# Patient Record
Sex: Female | Born: 1945 | Race: White | Hispanic: No | Marital: Married | Smoking: Former smoker
Health system: Southern US, Community
[De-identification: ages and names within clinical notes are randomized; demographics above are authoritative.]

## PROBLEM LIST (undated history)

## (undated) DIAGNOSIS — E119 Type 2 diabetes mellitus without complications: Secondary | ICD-10-CM

## (undated) DIAGNOSIS — I10 Essential (primary) hypertension: Secondary | ICD-10-CM

## (undated) DIAGNOSIS — E785 Hyperlipidemia, unspecified: Secondary | ICD-10-CM

## (undated) DIAGNOSIS — F419 Anxiety disorder, unspecified: Secondary | ICD-10-CM

## (undated) HISTORY — PX: BACK SURGERY: SHX140

---

## 2008-06-22 ENCOUNTER — Ambulatory Visit: Payer: Self-pay | Admitting: Family Medicine

## 2008-06-22 DIAGNOSIS — E785 Hyperlipidemia, unspecified: Secondary | ICD-10-CM | POA: Insufficient documentation

## 2008-06-22 DIAGNOSIS — I10 Essential (primary) hypertension: Secondary | ICD-10-CM | POA: Insufficient documentation

## 2008-06-22 DIAGNOSIS — E109 Type 1 diabetes mellitus without complications: Secondary | ICD-10-CM | POA: Insufficient documentation

## 2008-06-22 DIAGNOSIS — J209 Acute bronchitis, unspecified: Secondary | ICD-10-CM | POA: Insufficient documentation

## 2008-06-23 ENCOUNTER — Telehealth: Payer: Self-pay | Admitting: Family Medicine

## 2010-05-05 IMAGING — CR DG CHEST 2V
2 series · 2 of 2 positions shown · non-contrast
Comparison: None

CLINICAL DATA: Cough and congestion with bronchitis symptoms for 4
days.

CHEST - 2 VIEW

[view not recorded (1 of 2)]
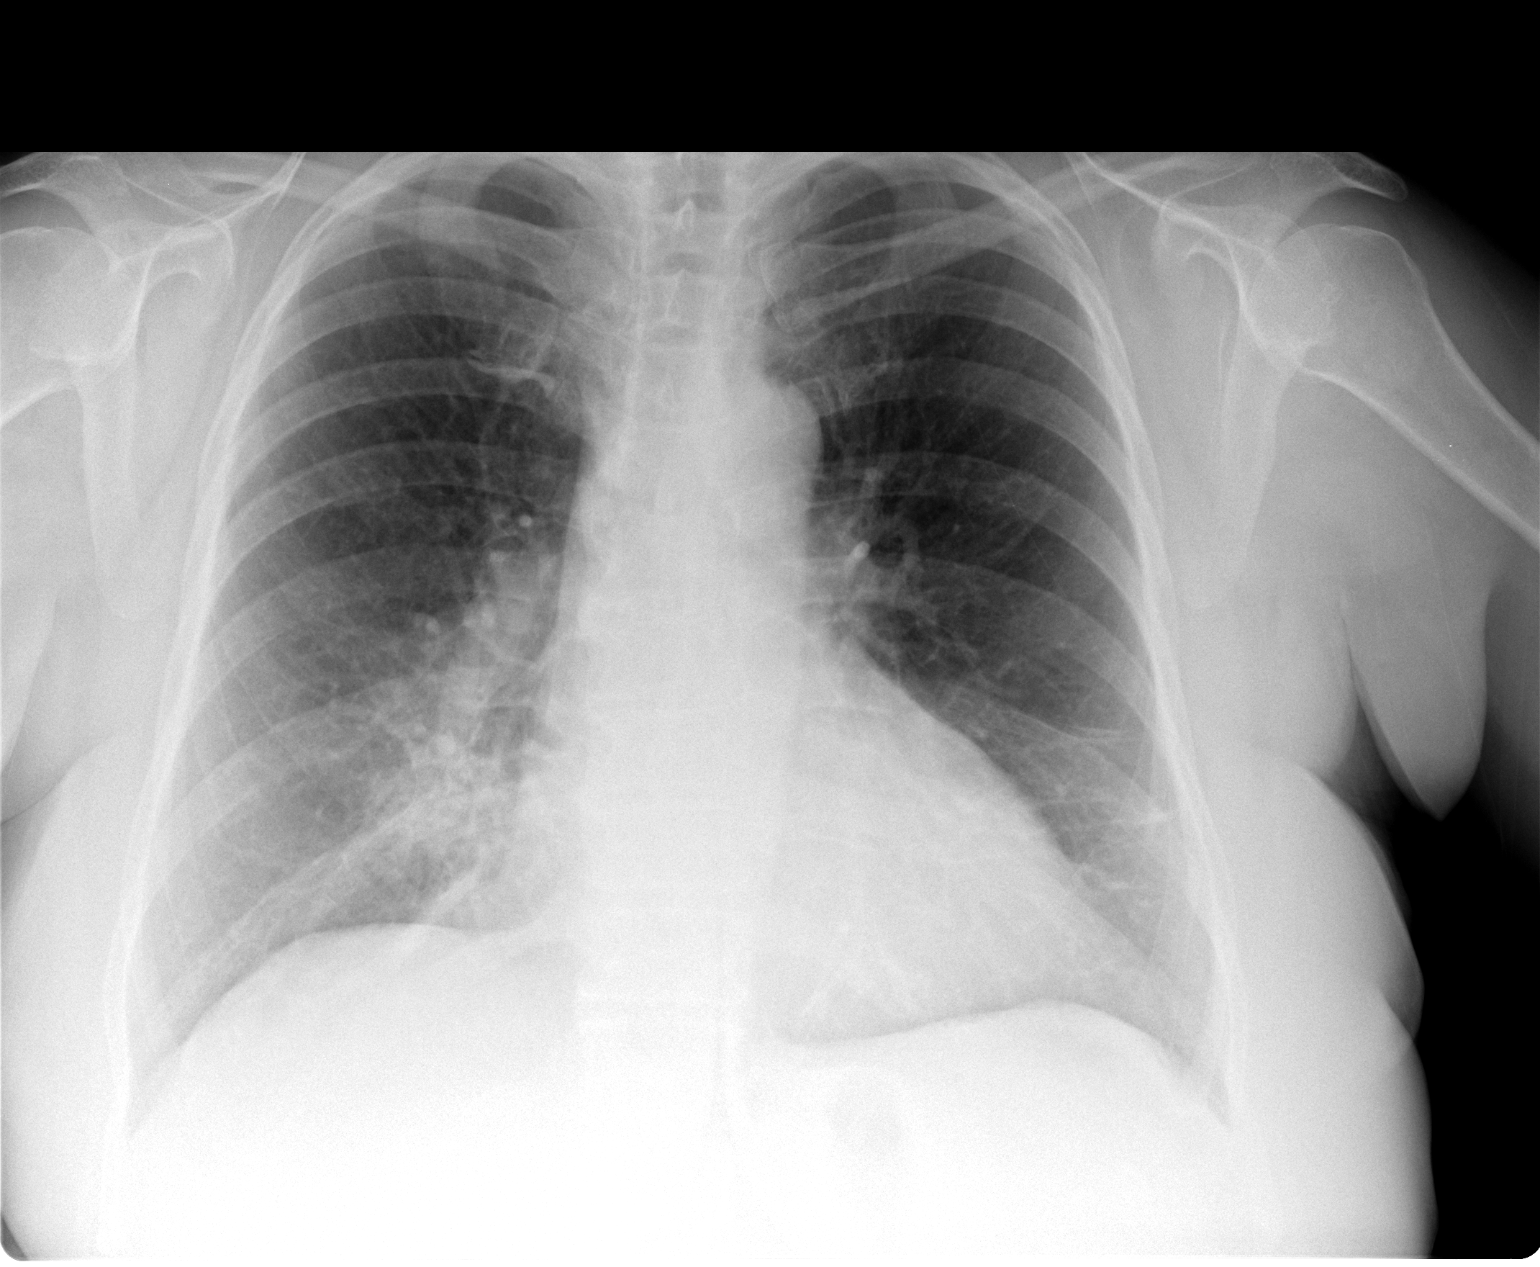

[view not recorded (2 of 2)]
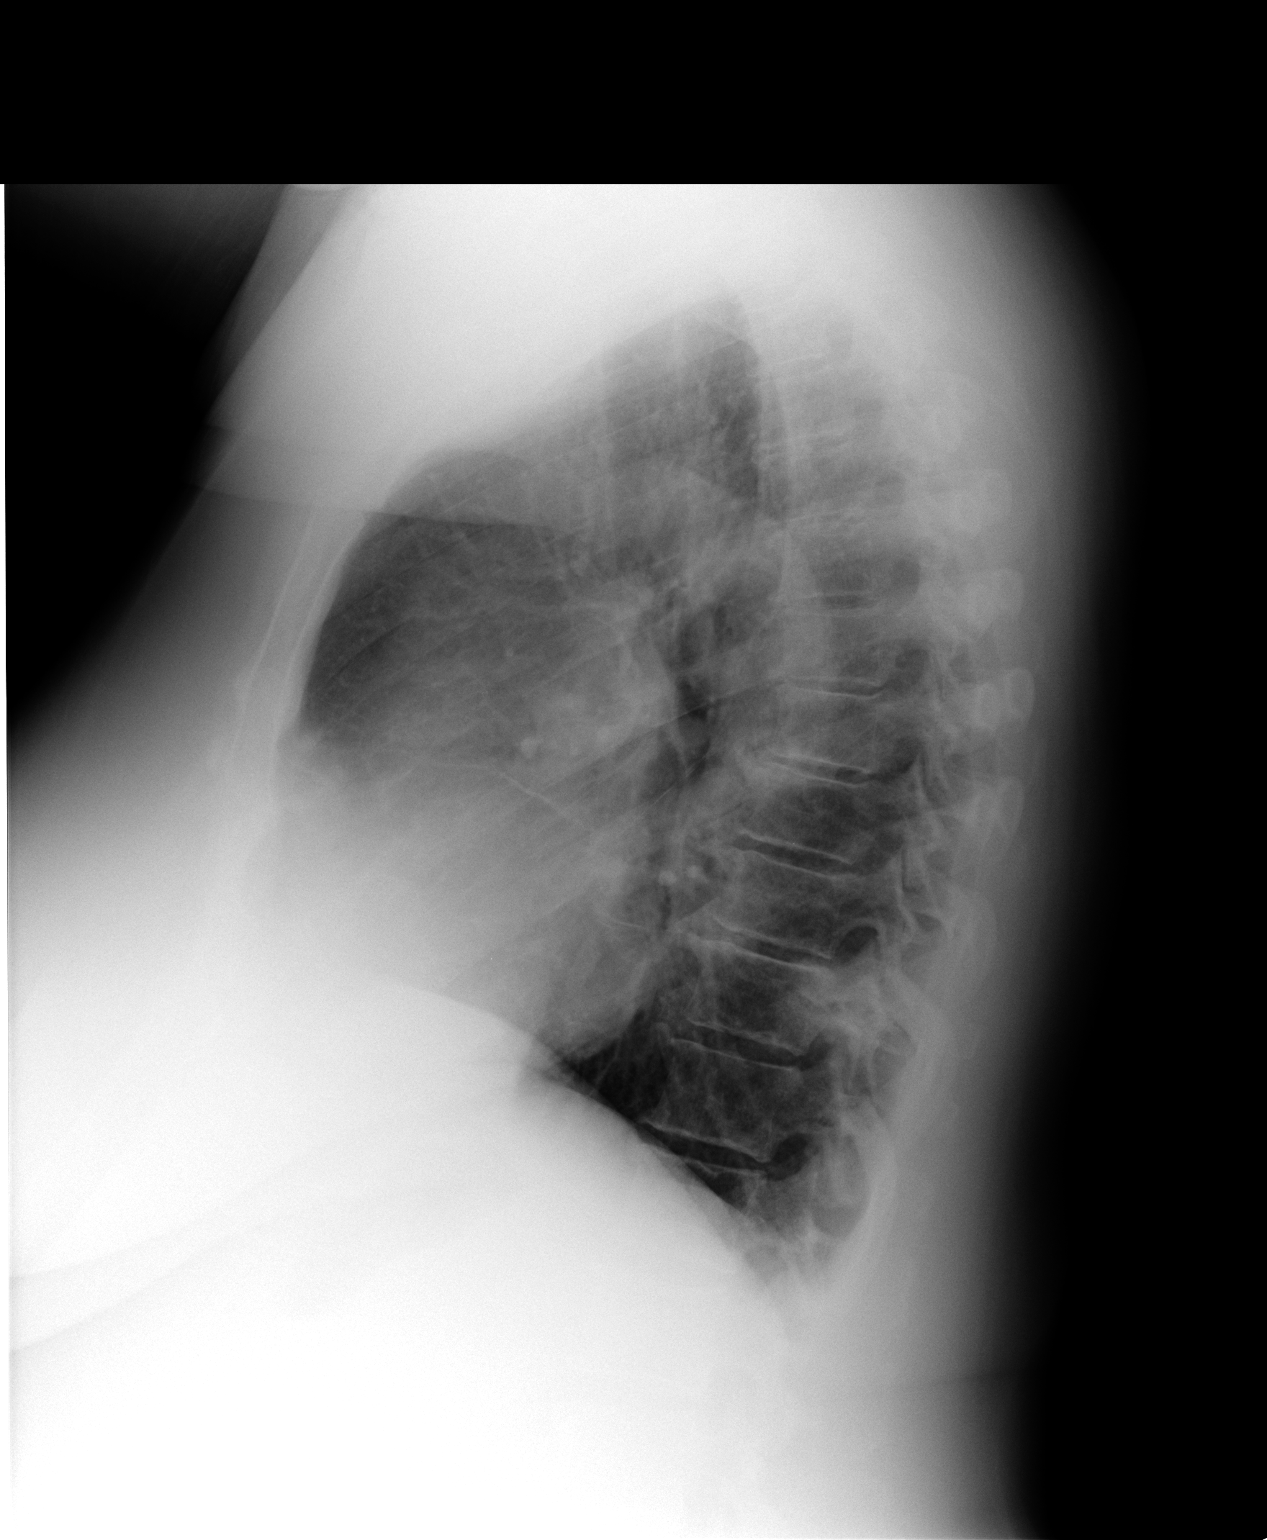

[2 of 2 positions shown; findings below may reference images not displayed]

FINDINGS: Posterior medial right lower lobe focal approximate 4 cm
round opacity consistent with bronchopneumonia with probable slight
central air bronchograms is seen.  Linear scarring or minimal
atelectasis is seen at the subpleural left lower lobe.  Lungs are
otherwise clear.  Heart size is upper limits of normal.
Mediastinum, hila, pleura and slight degenerative changes dorsal
spine are otherwise unremarkable.
IMPRESSION: 1.  Posteromedial right lower lobe round focal probable
bronchopneumonia in light of patient's symptoms.  Recommend follow-
up chest x-ray in 1-2 months to complete clearing to exclude remote
possibility of primary lung neoplasm as clinically indicated.
2.  Otherwise no additional significant acute findings.

## 2015-03-10 ENCOUNTER — Encounter: Payer: Self-pay | Admitting: Emergency Medicine

## 2015-03-10 ENCOUNTER — Emergency Department
Admission: EM | Admit: 2015-03-10 | Discharge: 2015-03-10 | Disposition: A | Discharge status: Home / Self Care | Payer: Medicare Other | Source: Home / Self Care | Attending: Family Medicine | Admitting: Family Medicine

## 2015-03-10 DIAGNOSIS — R350 Frequency of micturition: Secondary | ICD-10-CM

## 2015-03-10 DIAGNOSIS — R3915 Urgency of urination: Secondary | ICD-10-CM

## 2015-03-10 HISTORY — DX: Anxiety disorder, unspecified: F41.9

## 2015-03-10 HISTORY — DX: Hyperlipidemia, unspecified: E78.5

## 2015-03-10 HISTORY — DX: Type 2 diabetes mellitus without complications: E11.9

## 2015-03-10 HISTORY — DX: Essential (primary) hypertension: I10

## 2015-03-10 LAB — POCT URINALYSIS DIP (MANUAL ENTRY)
Bilirubin, UA: NEGATIVE
Blood, UA: NEGATIVE
Glucose, UA: NEGATIVE
Ketones, POC UA: NEGATIVE
Nitrite, UA: NEGATIVE
Protein Ur, POC: NEGATIVE
Spec Grav, UA: 1.02
Urobilinogen, UA: NEGATIVE
pH, UA: 5.5

## 2015-03-10 NOTE — Discharge Instructions (Signed)
Your urine was sent off for a culture.  The results typically come back in 2-3 days to determine if you had a bladder infection.  Today, only a trace amount of bacteria was seen in your urine, so it is unlikely you need antibiotics to treat your symptoms.  If the urine culture comes back normal and confirms no evidence of bladder infection, it is recommended you call to follow up with your neurosurgeon as sometimes nerves in your back can cause urinary symptoms.  Please call 911 or go to closest emergency department if you develop a fever, severe back or abdominal pain, unable to urinate at all, or other new concerning symptoms develop.   Urinary Frequency The number of times a normal person urinates depends upon how much liquid they take in and how much liquid they are losing. If the temperature is hot and there is high humidity, then the person will sweat more and usually breathe a little more frequently. These factors decrease the amount of frequency of urination that would be considered normal. The amount you drink is easily determined, but the amount of fluid lost is sometimes more difficult to calculate.  Fluid is lost in two ways:  Sensible fluid loss is usually measured by the amount of urine that you get rid of. Losses of fluid can also occur with diarrhea.  Insensible fluid loss is more difficult to measure. It is caused by evaporation. Insensible loss of fluid occurs through breathing and sweating. It usually ranges from a little less than a quart to a little more than a quart of fluid a day. In normal temperatures and activity levels, the average person may urinate 4 to 7 times in a 24-hour period. Needing to urinate more often than that could indicate a problem. If one urinates 4 to 7 times in 24 hours and has large volumes each time, that could indicate a different problem from one who urinates 4 to 7 times a day and has small volumes. The time of urinating is also important. Most  urinating should be done during the waking hours. Getting up at night to urinate frequently can indicate some problems. CAUSES  The bladder is the organ in your lower abdomen that holds urine. Like a balloon, it swells some as it fills up. Your nerves sense this and tell you it is time to head for the bathroom. There are a number of reasons that you might feel the need to urinate more often than usual. They include:  Urinary tract infection. This is usually associated with other signs such as burning when you urinate.  In men, problems with the prostate (a walnut-size gland that is located near the tube that carries urine out of your body). There are two reasons why the prostate can cause an increased frequency of urination:  An enlarged prostate that does not let the bladder empty well. If the bladder only half empties when you urinate, then it only has half the capacity to fill before you have to urinate again.  The nerves in the bladder become more hypersensitive with an increased size of the prostate even if the bladder empties completely.  Pregnancy.  Obesity. Excess weight is more likely to cause a problem for women than for men.  Bladder stones or other bladder problems.  Caffeine.  Alcohol.  Medications. For example, drugs that help the body get rid of extra fluid (diuretics) increase urine production. Some other medicines must be taken with lots of fluids.  Muscle or  nerve weakness. This might be the result of a spinal cord injury, a stroke, multiple sclerosis, or Parkinson disease.  Long-standing diabetes can decrease the sensation of the bladder. This loss of sensation makes it harder to sense the bladder needs to be emptied. Over a period of years, the bladder is stretched out by constant overfilling. This weakens the bladder muscles so that the bladder does not empty well and has less capacity to fill with new urine.  Interstitial cystitis (also called painful bladder  syndrome). This condition develops because the tissues that line the inside of the bladder are inflamed (inflammation is the body's way of reacting to injury or infection). It causes pain and frequent urination. It occurs in women more often than in men. DIAGNOSIS   To decide what might be causing your urinary frequency, your health care provider will probably:  Ask about symptoms you have noticed.  Ask about your overall health. This will include questions about any medications you are taking.  Do a physical examination.  Order some tests. These might include:  A blood test to check for diabetes or other health issues that could be contributing to the problem.  Urine testing. This could measure the flow of urine and the pressure on the bladder.  A test of your neurological system (the brain, spinal cord, and nerves). This is the system that senses the need to urinate.  A bladder test to check whether it is emptying completely when you urinate.  Cystoscopy. This test uses a thin tube with a tiny camera on it. It offers a look inside your urethra and bladder to see if there are problems.  Imaging tests. You might be given a contrast dye and then asked to urinate. X-rays are taken to see how your bladder is working. TREATMENT  It is important for you to be evaluated to determine if the amount or frequency that you have is unusual or abnormal. If it is found to be abnormal, the cause should be determined and this can usually be found out easily. Depending upon the cause, treatment could include medication, stimulation of the nerves, or surgery. There are not too many things that you can do as an individual to change your urinary frequency. It is important that you balance the amount of fluid intake needed to compensate for your activity and the temperature. Medical problems will be diagnosed and taken care of by your physician. There is no particular bladder training such as Kegel exercises  that you can do to help urinary frequency. This is an exercise that is usually recommended for people who have leaking of urine when they laugh, cough, or sneeze. HOME CARE INSTRUCTIONS   Take any medications your health care provider prescribed or suggested. Follow the directions carefully.  Practice any lifestyle changes that are recommended. These might include:  Drinking less fluid or drinking at different times of the day. If you need to urinate often during the night, for example, you may need to stop drinking fluids early in the evening.  Cutting down on caffeine or alcohol. They both can make you need to urinate more often than normal. Caffeine is found in coffee, tea, and sodas.  Losing weight, if that is recommended.  Keep a journal or a log. You might be asked to record how much you drink and when and where you feel the need to urinate. This will also help evaluate how well the treatment provided by your physician is working. SEEK MEDICAL CARE IF:  Your need to urinate often gets worse.  You feel increased pain or irritation when you urinate.  You notice blood in your urine.  You have questions about any medications that your health care provider recommended.  You notice blood, pus, or swelling at the site of any test or treatment procedure.  You develop a fever of more than 100.27F (38.1C). SEEK IMMEDIATE MEDICAL CARE IF:  You develop a fever of more than 102.32F (38.9C).   This information is not intended to replace advice given to you by your health care provider. Make sure you discuss any questions you have with your health care provider.   Document Released: 01/05/2009 Document Revised: 04/01/2014 Document Reviewed: 01/05/2009 Elsevier Interactive Patient Education [employer]

## 2015-03-10 NOTE — ED Notes (Signed)
Pt c/o urinary frequency and urgency for one week. Had back surgery in September and states she has had urinary complications since then.

## 2015-03-10 NOTE — ED Provider Notes (Signed)
CSN: [id number]     Arrival date & time 03/10/15  1313 History   First MD Initiated Contact with Patient 03/10/15 1338     Chief Complaint  Patient presents with  . Urinary Tract Infection   (Consider location/radiation/quality/duration/timing/severity/associated sxs/prior Treatment) HPI  Pt is a 69yo female presenting to [facility] with concern for a UTI as pt has had urinary frequency and urgency for one week. Pt states it feels like she has to go but when she tries, not much happens. Pt notes she had lumbar spinal surgery about 3 months ago and does recall her surgeon mentioning urinary retention but pt wanted to make sure her symptoms were not due to a UTI before calling her surgeon. Denies fever, chills, n/v/d. Denies abdominal pain or new back pain. 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 Denies new numbness or pain in her lower extremities. Denies loss of bowel or bladder.    Past Medical History  Diagnosis Date  . Diabetes mellitus without complication (HCC)   . Hypertension   . Hyperlipemia   . Anxiety    Past Surgical History  Procedure Laterality Date  . Back surgery     Family History  Problem Relation Age of Onset  . Heart failure Father    Social History  Substance Use Topics  . Smoking status: Former Games developer  . Smokeless tobacco: None  . Alcohol Use: No   OB History    No data available     Review of Systems  Constitutional: Negative for fever and chills.  Gastrointestinal: Negative for nausea, vomiting, abdominal pain and diarrhea.  Genitourinary: Positive for urgency, frequency and decreased urine volume. Negative for dysuria, hematuria, flank pain, vaginal bleeding, vaginal discharge, vaginal pain and pelvic pain.  Musculoskeletal: Negative for myalgias, back pain and arthralgias.    Allergies  Prozac  Home Medications   Prior to Admission medications   Medication Sig Start Date End Date Taking? Authorizing Provider   ALPRAZolam Prudy Feeler) 0.25 MG tablet Take 0.25 mg by mouth at bedtime as needed for anxiety.   Yes Historical Provider, MD  gabapentin (NEURONTIN) 100 MG capsule Take 100 mg by mouth 3 (three) times daily.   Yes Historical Provider, MD  losartan (COZAAR) 100 MG tablet Take 100 mg by mouth daily.   Yes Historical Provider, MD  metFORMIN (GLUCOPHAGE) 1000 MG tablet Take 1,000 mg by mouth 2 (two) times daily with a meal.   Yes Historical Provider, MD  sertraline (ZOLOFT) 100 MG tablet Take 100 mg by mouth daily.   Yes Historical Provider, MD   Meds Ordered and Administered this Visit  Medications - No data to display  BP 170/78 mmHg  Pulse 72  Temp(Src) 98.6 F (37 C) (Oral)  SpO2 99% No data found.   Physical Exam  Constitutional: She appears well-developed and well-nourished. No distress.  HENT:  Head: Normocephalic and atraumatic.  Eyes: Conjunctivae are normal. No scleral icterus.  Neck: Normal range of motion.  Cardiovascular: Normal rate, regular rhythm and normal heart sounds.   Pulmonary/Chest: Effort normal and breath sounds normal. No respiratory distress. She has no wheezes. She has no rales. She exhibits no tenderness.  Abdominal: Soft. She exhibits no distension and no mass. There is no tenderness. There is no rebound, no guarding and no CVA tenderness.  Musculoskeletal: Normal range of motion.  No midline spinal tenderness, full ROM upper and lower extremities bilaterally  Neurological: She is alert.  Antalgic  gait, uses cane for assistance ambulating  Skin: Skin is warm and dry. She is not diaphoretic.  Well healed surgical scar along lumbar spine  Nursing note and vitals reviewed.   ED Course  Procedures (including critical care time)  Labs Review Labs Reviewed  POCT URINALYSIS DIP (MANUAL ENTRY) - Abnormal; Notable for the following:    Leukocytes, UA Trace (*)    All other components within normal limits  URINE CULTURE    Imaging Review No results  found.    MDM   1. Urinary frequency   2. Urinary urgency    Pt c/o urinary frequency and urgency. Spinal surgery 3 months ago, symptoms started 1 week ago. Pt is afebrile, well healing surgical incision. No CVAT. No abdominal tenderness.   UA: trace leukocytes, doubt UTI, will send urine culture Encouraged pt to f/u with her neurosurgeon in 2-3 days if urine culture comes back normal.  Discussed symptoms that warrant emergent care in the ED including worsening urinary symptoms (cannot void or incontinence), fever, or severe pain. Patient verbalized understanding and agreement with treatment plan.     Junius Finnerrin O'Malley, PA-C 03/10/15 1416

## 2015-03-11 LAB — URINE CULTURE: Colony Count: 8000

## 2015-03-12 ENCOUNTER — Telehealth: Payer: Self-pay | Admitting: Emergency Medicine

## 2019-11-13 ENCOUNTER — Other Ambulatory Visit: Payer: Self-pay

## 2019-11-13 ENCOUNTER — Emergency Department
Admission: RE | Admit: 2019-11-13 | Discharge: 2019-11-13 | Disposition: A | Discharge status: Home / Self Care | Payer: Medicare Other | Source: Ambulatory Visit

## 2019-11-13 VITALS — BP 170/81 | HR 59 | Temp 98.7°F | Resp 16 | Ht 65.0 in | Wt 190.0 lb

## 2019-11-13 DIAGNOSIS — N3000 Acute cystitis without hematuria: Secondary | ICD-10-CM

## 2019-11-13 DIAGNOSIS — R350 Frequency of micturition: Secondary | ICD-10-CM | POA: Diagnosis not present

## 2019-11-13 LAB — POCT URINALYSIS DIP (MANUAL ENTRY)
Bilirubin, UA: NEGATIVE
Blood, UA: NEGATIVE
Glucose, UA: NEGATIVE mg/dL
Ketones, POC UA: NEGATIVE mg/dL
Nitrite, UA: NEGATIVE
Protein Ur, POC: NEGATIVE mg/dL
Spec Grav, UA: 1.015 (ref 1.010–1.025)
Urobilinogen, UA: 0.2 E.U./dL
pH, UA: 5.5 (ref 5.0–8.0)

## 2019-11-13 MED ORDER — CEPHALEXIN 500 MG PO CAPS: 500.0000 mg | ORAL_CAPSULE | Freq: Two times a day (BID) | ORAL | 0 refills | Status: AC

## 2019-11-13 NOTE — Discharge Instructions (Signed)
  Please take your antibiotic as prescribed. A urine culture has been sent to check the severity of your urinary infection and to determine if you are on the most appropriate antibiotic. The results should come back within 2-3 days. You will only be notified if a medication change is indicated.  Please follow up with family medicine or urology if not improving within 1 week, sooner if symptoms worsening.   

## 2019-11-13 NOTE — ED Provider Notes (Signed)
[facility]    CSN: [id number] Arrival date & time: 11/13/19  1111      History   Chief Complaint Chief Complaint  Patient presents with  . Urinary Frequency    HPI Danielle Wade is a 74 y.o. female.   HPI  Danielle Wade is a 74 y.o. female presenting to UC with c/o urinary frequency, bladder pressure and concern for UTI. She was treated for mild UTI about 2 weeks ago, found incidentally after being worked up at [facility] ER for a fall.  Denies fever, chills, n/v/d.     Past Medical History:  Diagnosis Date  . Anxiety   . Diabetes mellitus without complication (HCC)   . Hyperlipemia   . Hypertension     Patient Active Problem List   Diagnosis Date Noted  . DIABETES MELLITUS, TYPE I 06/22/2008  . HYPERLIPIDEMIA 06/22/2008  . HYPERTENSION 06/22/2008  . BRONCHITIS, ACUTE 06/22/2008    Past Surgical History:  Procedure Laterality Date  . BACK SURGERY      OB History   No obstetric history on file.      Home Medications    Prior to Admission medications   Medication Sig Start Date End Date Taking? Authorizing Provider  ALPRAZolam Prudy Feeler) 0.25 MG tablet Take 0.25 mg by mouth at bedtime as needed for anxiety.    [provider]  cephALEXin (KEFLEX) 500 MG capsule Take 1 capsule (500 mg total) by mouth 2 (two) times daily. 11/13/19   Lurene Shadow, PA-C  gabapentin (NEURONTIN) 100 MG capsule Take 100 mg by mouth 3 (three) times daily.    [provider]  losartan (COZAAR) 100 MG tablet Take 100 mg by mouth daily.    [provider]  metFORMIN (GLUCOPHAGE) 1000 MG tablet Take 1,000 mg by mouth 2 (two) times daily with a meal.    [provider]  sertraline (ZOLOFT) 100 MG tablet Take 100 mg by mouth daily.    [provider]    Family History Family History  Problem Relation Age of Onset  . Heart failure Father     Social History Social History   Tobacco Use  . Smoking status: Former Smoker    Substance Use Topics  . Alcohol use: No  . Drug use: No     Allergies   Prozac [fluoxetine] and Statins   Review of Systems Review of Systems  Constitutional: Negative for chills and fever.  HENT: Negative for congestion, ear pain, sore throat, trouble swallowing and voice change.   Respiratory: Negative for cough and shortness of breath.   Cardiovascular: Negative for chest pain and palpitations.  Gastrointestinal: Positive for abdominal pain (bladder pressure). Negative for diarrhea, nausea and vomiting.  Genitourinary: Positive for frequency, pelvic pain (bladder pressure) and urgency. Negative for dysuria, vaginal bleeding, vaginal discharge and vaginal pain.  Musculoskeletal: Negative for arthralgias, back pain and myalgias.  Skin: Negative for rash.  All other systems reviewed and are negative.    Physical Exam Triage Vital Signs ED Triage Vitals  Enc Vitals Group     BP 11/13/19 1301 (!) 170/81     Pulse Rate 11/13/19 1301 (!) 59     Resp 11/13/19 1301 16     Temp 11/13/19 1301 98.7 F (37.1 C)     Temp Source 11/13/19 1301 Oral     SpO2 11/13/19 1301 99 %     Weight 11/13/19 1302 190 lb (86.2 kg)     Height 11/13/19 1302  5\' 5"  (1.651 m)     Head Circumference --      Peak Flow --      Pain Score 11/13/19 1302 2     Pain Loc --      Pain Edu? --      Excl. in GC? --    No data found.  Updated Vital Signs BP (!) 170/81 (BP Location: Right Wrist)   Pulse (!) 59   Temp 98.7 F (37.1 C) (Oral)   Resp 16   Ht 5\' 5"  (1.651 m)   Wt 190 lb (86.2 kg)   SpO2 99%   BMI 31.62 kg/m   Visual Acuity Right Eye Distance:   Left Eye Distance:   Bilateral Distance:    Right Eye Near:   Left Eye Near:    Bilateral Near:     Physical Exam Vitals and nursing note reviewed.  Constitutional:      General: She is not in acute distress.    Appearance: Normal appearance. She is well-developed. She is not ill-appearing, toxic-appearing or diaphoretic.  HENT:      Head: Normocephalic and atraumatic.     Nose: Nose normal.     Mouth/Throat:     Mouth: Mucous membranes are moist.     Pharynx: Oropharynx is clear.  Cardiovascular:     Rate and Rhythm: Normal rate and regular rhythm.  Pulmonary:     Effort: Pulmonary effort is normal. No respiratory distress.     Breath sounds: Normal breath sounds. No stridor. No wheezing, rhonchi or rales.  Abdominal:     General: There is no distension.     Palpations: Abdomen is soft. There is no mass.     Tenderness: There is no abdominal tenderness. There is no right CVA tenderness, left CVA tenderness, guarding or rebound.     Hernia: No hernia is present.  Musculoskeletal:        General: Normal range of motion.     Cervical back: Normal range of motion and neck supple.  Skin:    General: Skin is warm and dry.  Neurological:     Mental Status: She is alert and oriented to person, place, and time.  Psychiatric:        Behavior: Behavior normal.      UC Treatments / Results  Labs (all labs ordered are listed, but only abnormal results are displayed) Labs Reviewed  POCT URINALYSIS DIP (MANUAL ENTRY) - Abnormal; Notable for the following components:      Result Value   Color, UA light yellow (*)    Leukocytes, UA Small (1+) (*)    All other components within normal limits  URINE CULTURE    EKG   Radiology No results found.  Procedures Procedures (including critical care time)  Medications Ordered in UC Medications - No data to display  Initial Impression / Assessment and Plan / UC Course  I have reviewed the triage vital signs and the nursing notes.  Pertinent labs & imaging results that were available during my care of the patient were reviewed by me and considered in my medical decision making (see chart for details).     Hx and UA c/w mild UTI Culture sent Will start on antibiotics F/u with PCP AVS given  Final Clinical Impressions(s) / UC Diagnoses   Final diagnoses:    Frequency of urination  Acute cystitis without hematuria     Discharge Instructions      Please take your antibiotic as prescribed.  A urine culture has been sent to check the severity of your urinary infection and to determine if you are on the most appropriate antibiotic. The results should come back within 2-3 days. You will only be notified if a medication change is indicated.  Please follow up with family medicine or urology if not improving within 1 week, sooner if symptoms worsening.      ED Prescriptions    Medication Sig Dispense Auth. Provider   cephALEXin (KEFLEX) 500 MG capsule Take 1 capsule (500 mg total) by mouth 2 (two) times daily. 14 capsule Lurene Shadow, [place]     PDMP not reviewed this encounter.   Lurene Shadow, [place] 11/15/19 1235

## 2019-11-13 NOTE — ED Triage Notes (Signed)
Patient fell 2 weeks ago; seen in [place] ER and in course of evaluation decided she had UTI; treated with unknown rx; now continues to have frequency of urination and wonders if if is UTI. Has had covid vaccinations.

## 2019-11-14 LAB — URINE CULTURE
MICRO NUMBER:: 10857435
SPECIMEN QUALITY:: ADEQUATE
